# Patient Record
Sex: Male | Born: 1990 | Race: Black or African American | Hispanic: No | Marital: Single | Smoking: Current every day smoker
Health system: Southern US, Community
[De-identification: ages and names within clinical notes are randomized; demographics above are authoritative.]

## PROBLEM LIST (undated history)

## (undated) ENCOUNTER — Emergency Department: Discharge status: Absent without leave | Payer: Self-pay

## (undated) DIAGNOSIS — F39 Unspecified mood [affective] disorder: Secondary | ICD-10-CM

## (undated) HISTORY — PX: NO PAST SURGERIES: SHX2092

---
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
## 2017-05-22 ENCOUNTER — Emergency Department (HOSPITAL_COMMUNITY)
Admission: EM | Admit: 2017-05-22 | Discharge: 2017-05-23 | Disposition: A | Discharge status: Home / Self Care | Payer: Medicaid Other | Attending: Emergency Medicine | Admitting: Emergency Medicine

## 2017-05-22 ENCOUNTER — Encounter (HOSPITAL_COMMUNITY): Payer: Self-pay | Admitting: Emergency Medicine

## 2017-05-22 ENCOUNTER — Emergency Department (HOSPITAL_COMMUNITY): Payer: Medicaid Other

## 2017-05-22 DIAGNOSIS — Y999 Unspecified external cause status: Secondary | ICD-10-CM | POA: Diagnosis not present

## 2017-05-22 DIAGNOSIS — W25XXXA Contact with sharp glass, initial encounter: Secondary | ICD-10-CM | POA: Diagnosis not present

## 2017-05-22 DIAGNOSIS — S51811A Laceration without foreign body of right forearm, initial encounter: Secondary | ICD-10-CM

## 2017-05-22 DIAGNOSIS — Y929 Unspecified place or not applicable: Secondary | ICD-10-CM | POA: Insufficient documentation

## 2017-05-22 DIAGNOSIS — Y939 Activity, unspecified: Secondary | ICD-10-CM | POA: Diagnosis not present

## 2017-05-22 DIAGNOSIS — S41111A Laceration without foreign body of right upper arm, initial encounter: Secondary | ICD-10-CM

## 2017-05-22 DIAGNOSIS — Z880 Allergy status to penicillin: Secondary | ICD-10-CM | POA: Insufficient documentation

## 2017-05-22 MED ORDER — LIDOCAINE-EPINEPHRINE (PF) 2 %-1:200000 IJ SOLN
20.0000 mL | Freq: Once | INTRAMUSCULAR | Status: AC
  Administered 2017-05-23: 20 mL
  Filled 2017-05-22: qty 20

## 2017-05-22 NOTE — ED Provider Notes (Signed)
[facility] Provider Note   CSN: [id number] Arrival date & time: 05/22/17  2211    History   Chief Complaint Chief Complaint  Patient presents with  . Laceration    The history is provided by the patient. No language interpreter was used.     HPI Comments:  Alvin Moore is a 26 y.o. male who presents to the Emergency Department complaining of laceration to the right forearm after trying to catch a window as it was falling. Patient states that the glass broke and it cut his right forearm. Bleeding was controlled with direct pressure on scene. He reports increasing pain and therefore decreased range of motion of the right wrist and fingers. He is also reporting tingling and numbness of the right pinky on the palmar side. He denies injury elsewhere. His last tetanus shot was a year ago. He denies any bleeding or clotting disorders.   History reviewed. No pertinent past medical history.  There are no active problems to display for this patient.   History reviewed. No pertinent surgical history.     Home Medications    Prior to Admission medications   Medication Sig Start Date End Date Taking? Authorizing Provider  HYDROcodone-acetaminophen (NORCO/VICODIN) 5-325 MG tablet Take 1 tablet by mouth every 6 (six) hours as needed for severe pain. 05/23/17   Gavin Faivre, PA-C    Family History No family history on file.  Social History Social History  Substance Use Topics  . Smoking status: Never Smoker  . Smokeless tobacco: Never Used  . Alcohol use Yes     Allergies   Amoxicillin and Penicillins   Review of Systems Review of Systems  Skin: Positive for wound (2 laceration of R anterior forearm).  Neurological: Positive for numbness (and tingling of R pinky).     Physical Exam Updated Vital Signs BP (!) 148/111 (BP Location: Left Arm)   Pulse 87   Temp 98.3 F (36.8 C) (Oral)   Resp 18   Ht 5\' 4"  (1.626 m)   Wt 77.1 kg (170 lb)   SpO2 99%    BMI 29.18 kg/m   Physical Exam  Constitutional: He is oriented to person, place, and time. He appears well-developed and well-nourished. No distress.  HENT:  Head: Normocephalic and atraumatic.  Eyes: Conjunctivae are normal.  Cardiovascular: Normal rate.   Pulmonary/Chest: Effort normal.  Abdominal: He exhibits no distension.  Musculoskeletal:  Patient with limited range of motion of the right wrist and fingers due to pain. Pulses intact bilaterally, warmth and color of hands equal bilaterally.  Neurological: He is alert and oriented to person, place, and time.  Skin: Skin is warm and dry.  2 lacerations to the anterior right forearm. One laceration is about 4 cm long and 1/2 cm deep. Second laceration is about 3 cm long and 1 cm deep. There is no obvious foreign body. There is no obvious muscle or nerve damage.  Nursing note and vitals reviewed.    ED Treatments / Results  DIAGNOSTIC STUDIES:  Oxygen Saturation is 100% on RA, normal by my interpretation.    COORDINATION OF CARE:  2:54 AM Discussed treatment plan with pt at bedside and pt agreed to plan.  Labs (all labs ordered are listed, but only abnormal results are displayed) Labs Reviewed - No data to display  EKG  EKG Interpretation None       Radiology Dg Forearm Right  Result Date: 05/22/2017 CLINICAL DATA:  laceration EXAM: RIGHT FOREARM - 2  VIEW COMPARISON:  None. FINDINGS: There is no evidence of fracture or other focal bone lesions. Soft tissues are unremarkable. IMPRESSION: Negative. Electronically Signed   By: Jasmine PangKim  Fujinaga M.D.   On: 05/22/2017 23:58    Procedures .Marland Kitchen.Laceration Repair Date/Time: 05/23/2017 1:04 AM Performed by: Alveria ApleyACCAVALE, Surena Welge Authorized by: Alveria ApleyACCAVALE, Shereka Lafortune   Consent:    Consent obtained:  Verbal   Consent given by:  Patient   Risks discussed:  Infection, pain and need for additional repair   Alternatives discussed:  No treatment Anesthesia (see MAR for exact dosages):     Anesthesia method:  Local infiltration   Local anesthetic:  Lidocaine 2% WITH epi Laceration details:    Location:  Shoulder/arm   Shoulder/arm location:  R lower arm   Wound length (cm): 2 lacs: 4 cm, 3 cm.   Laceration depth: 2 lacs: 1.5 cm, 1 cm. Pre-procedure details:    Preparation:  Imaging obtained to evaluate for foreign bodies Exploration:    Hemostasis achieved with:  Direct pressure   Wound exploration: entire depth of wound probed and visualized     Wound extent: no foreign bodies/material noted and no underlying fracture noted     Contaminated: no   Treatment:    Area cleansed with:  Hibiclens and saline   Amount of cleaning:  Standard   Irrigation solution:  Sterile saline   Irrigation volume:  250 cc   Irrigation method:  Syringe Skin repair:    Repair method:  Sutures   Suture size:  4-0   Suture material:  Prolene (with chromic gut sub-q)   Suture technique:  Simple interrupted (horizonal mattress sub-q)   Number of sutures:  17 Approximation:    Approximation:  Close   Vermilion border: well-aligned   Post-procedure details:    Dressing:  Antibiotic ointment and non-adherent dressing   Patient tolerance of procedure:  Tolerated well, no immediate complications   (including critical care time)  Medications Ordered in ED Medications  lidocaine-EPINEPHrine (XYLOCAINE W/EPI) 2 %-1:200000 (PF) injection 20 mL (20 mLs Infiltration Given by Other 05/23/17 0001)     Initial Impression / Assessment and Plan / ED Course  I have reviewed the triage vital signs and the nursing notes.  Pertinent labs & imaging results that were available during my care of the patient were reviewed by me and considered in my medical decision making (see chart for details).    Initial assessment showed patient with decreased range of motion of right wrist and fingers due to pain. After lacerations were numbed and stitches were placed, patient had full range of motion of his wrist and  all fingers. He reports loss of sensation in his pinky finger of his right hand. X-ray showed no foreign body. Discussed wound care at length. Patient to follow-up in 7-10 days for suture removal. If patient is still experiencing pinky numbness or tingling into to 3 days, he should follow-up with hand specialist. Return precautions given. Patient very concerned about pain, and small dose of Norco prescribed. NCCSRS checked and pt without multiple prescriptions or chronic prescriptions. Patient states he understands and agrees to plan for discharge.   Final Clinical Impressions(s) / ED Diagnoses   Final diagnoses:  Laceration of right forearm, initial encounter    New Prescriptions Discharge Medication List as of 05/23/2017  1:18 AM    START taking these medications   Details  HYDROcodone-acetaminophen (NORCO/VICODIN) 5-325 MG tablet Take 1 tablet by mouth every 6 (six) hours as needed for severe  pain., Starting Thu 05/23/2017, Print          [place], [place], PA-C 05/23/17 0254    Gerhard Munch, MD 05/23/17 1234

## 2017-05-22 NOTE — ED Triage Notes (Signed)
Pt presents with laceration to R forearm, pt states he tried to catch a window as it fell, window broke causing laceration, wound wrapped with small amount of blood. Pt states he feels like he can not grasp with R hand normally.

## 2017-05-23 MED ORDER — HYDROCODONE-ACETAMINOPHEN 5-325 MG PO TABS: 1.0000 | ORAL_TABLET | Freq: Four times a day (QID) | ORAL | 0 refills | Status: DC | PRN

## 2017-05-23 NOTE — Discharge Instructions (Signed)
Keep the area covered and dry for the next 24 hours. After that, you may remove the dressing and gently wash the site. Continue to keep the area covered and you may apply triple antibiotic ointment or Neosporin on the area. You may use ibuprofen or Aleve for pain, or Norco for severe pain. Return to primary care, urgent care, or ED for suture removal in 7-10 days. If the numbness and tingling has not improved in 2-3 days, contact to make an appointment with Dr. Melvyn Novasrtmann above. Return to the emergency department if he developed fever, chills, streaking redness along her forearm, or pus draining from the cut.

## 2019-06-17 ENCOUNTER — Ambulatory Visit
Admission: EM | Admit: 2019-06-17 | Discharge: 2019-06-17 | Disposition: A | Discharge status: Home / Self Care | Payer: Medicaid Other | Attending: Emergency Medicine | Admitting: Emergency Medicine

## 2019-06-17 ENCOUNTER — Other Ambulatory Visit: Payer: Self-pay

## 2019-06-17 ENCOUNTER — Ambulatory Visit: Payer: Medicaid Other

## 2019-06-17 DIAGNOSIS — M79601 Pain in right arm: Secondary | ICD-10-CM | POA: Diagnosis present

## 2019-06-17 MED ORDER — IBUPROFEN 800 MG PO TABS: 800.0000 mg | ORAL_TABLET | Freq: Three times a day (TID) | ORAL | 0 refills | Status: DC

## 2019-06-17 MED ORDER — KETOROLAC TROMETHAMINE 60 MG/2ML IM SOLN
60.0000 mg | Freq: Once | INTRAMUSCULAR | Status: AC
  Administered 2019-06-17: 60 mg via INTRAMUSCULAR

## 2019-06-17 NOTE — ED Triage Notes (Signed)
Patient states that he is having numbness and pain in his right hand and arm. States that he is "having surgery on my arm muscles in August for this same issue". Patient states that he feels like the pain and numbness are worse today. States that pain is in fingers and radiates to elbow.

## 2019-06-17 NOTE — ED Provider Notes (Signed)
[facility]    CSN: [id number] Arrival date & time: 06/17/19  1606     History   Chief Complaint Chief Complaint  Patient presents with  . Arm Pain    right    HPI Alvin Moore is a 28 y.o. male presenting with increased numbness and tinglings to right arm. Pt states he has chronic numbness and tingling to arm since a laceration to his forearm 2 years ago. However, pt states increased numbness, swelling and pain to right forearm for 5 days, the worse occurrence being this AM. Pt did not try any home remedies to alleviate pain. Pt states the pain and numbness has gotten to the point that he cannot grip his steering wheel. No additional injuries of note.    History reviewed. No pertinent past medical history.  There are no active problems to display for this patient.   Past Surgical History:  Procedure Laterality Date  . NO PAST SURGERIES         Home Medications    Prior to Admission medications   Medication Sig Start Date End Date Taking? Authorizing Provider  divalproex (DEPAKOTE ER) 500 MG 24 hr tablet Take 1,500 mg by mouth at bedtime. 05/28/19  Yes [provider]  hydrOXYzine (VISTARIL) 50 MG capsule Take 100 mg by mouth at bedtime. 05/28/19  Yes [provider]  HYDROcodone-acetaminophen (NORCO/VICODIN) 5-325 MG tablet Take 1 tablet by mouth every 6 (six) hours as needed for severe pain. 05/23/17   [place], Sophia, PA-C  ibuprofen (ADVIL) 800 MG tablet Take 1 tablet (800 mg total) by mouth 3 (three) times daily. Take with food. 06/17/19   Bailey MechBenjamin, Flay Ghosh, NP    Family History Family History  Problem Relation Age of Onset  . Hypertension Mother   . Osteoarthritis Father     Social History Social History   Tobacco Use  . Smoking status: Current Every Day Smoker    Packs/day: 0.50    Types: Cigarettes  . Smokeless tobacco: Never Used  Substance Use Topics  . Alcohol use: Yes    Comment: occasionally  . Drug use: Yes   Types: Marijuana     Allergies   Amoxicillin and Penicillins   Review of Systems Review of Systems  Musculoskeletal: Positive for arthralgias and myalgias. Negative for joint swelling, neck pain and neck stiffness.     Physical Exam Triage Vital Signs ED Triage Vitals  Enc Vitals Group     BP 06/17/19 1622 (!) 131/99     Pulse Rate 06/17/19 1622 84     Resp 06/17/19 1622 16     Temp 06/17/19 1622 98.5 F (36.9 C)     Temp Source 06/17/19 1622 Oral     SpO2 06/17/19 1622 97 %     Weight 06/17/19 1618 202 lb (91.6 kg)     Height 06/17/19 1618 5\' 4"  (1.626 m)     Head Circumference --      Peak Flow --      Pain Score 06/17/19 1618 5     Pain Loc --      Pain Edu? --      Excl. in GC? --    No data found.  Updated Vital Signs BP (!) 131/99 (BP Location: Left Arm)   Pulse 84   Temp 98.5 F (36.9 C) (Oral)   Resp 16   Ht 5\' 4"  (1.626 m)   Wt 202 lb (91.6 kg)   SpO2 97%   BMI 34.67  kg/m   Physical Exam Vitals signs and nursing note reviewed.  Cardiovascular:     Pulses:          Radial pulses are 2+ on the right side and 2+ on the left side.  Musculoskeletal:     Right hand: He exhibits swelling. He exhibits normal capillary refill. Decreased sensation noted. Decreased strength noted.     Comments: Decreased strength to right arm. Unable to keep arm up with resistance.   Neurological:     Mental Status: He is alert and oriented to person, place, and time.     Sensory: Sensory deficit present.     Motor: Weakness present. No atrophy.  Psychiatric:        Thought Content: Thought content normal.        Judgment: Judgment normal.      UC Treatments / Results  Labs (all labs ordered are listed, but only abnormal results are displayed) Labs Reviewed - No data to display  EKG   Radiology Dg Cervical Spine Complete  Result Date: 06/17/2019 CLINICAL DATA:  Right arm and elbow pain with numbness, initial encounter EXAM: CERVICAL SPINE - COMPLETE 4+ VIEW  COMPARISON:  None. FINDINGS: Seven cervical segments are well visualized. Vertebral body height is well maintained. Neural foramina are widely patent. No prevertebral soft tissue abnormality is seen. The odontoid is within normal limits. Small cervical ribs are noted bilaterally. IMPRESSION: Small cervical ribs bilaterally.  No acute abnormality is noted. Electronically Signed   By: Inez Catalina M.D.   On: 06/17/2019 17:03   Unable to perform soft tissue imaging of right forearm due to lack of CT and pt's type of insurance.   Procedures (including critical care time)  Medications Ordered in UC Medications  ketorolac (TORADOL) injection 60 mg (60 mg Intramuscular Given 06/17/19 1723)    Initial Impression / Assessment and Plan / UC Course  I have reviewed the triage vital signs and the nursing notes.  Pertinent labs & imaging results that were available during my care of the patient were reviewed by me and considered in my medical decision making (see chart for details).     Pt presenting with right arm pain/numbness with decreased strength and treated as such with toradol in office and a prescription of ibuprofen 800mg  three times a day. Pt instructed to follow-up with PCP ASAP for further imaging. Pt instructed to go to ER if pain, lack of sensation and or/ swelling increases or doesn't resolve. All questions answered and all concerns addressed.   Final Clinical Impressions(s) / UC Diagnoses   Final diagnoses:  Right arm pain   Discharge Instructions   None    ED Prescriptions    Medication Sig Dispense Auth. Provider   ibuprofen (ADVIL) 800 MG tablet Take 1 tablet (800 mg total) by mouth 3 (three) times daily. Take with food. 30 tablet Gertie Baron, NP       Gertie Baron, NP 06/17/19 [phone]

## 2019-07-07 ENCOUNTER — Ambulatory Visit: Payer: Medicaid Other | Admitting: Physical Therapy

## 2019-07-08 ENCOUNTER — Ambulatory Visit: Payer: Medicaid Other | Attending: Orthopedic Surgery | Admitting: Physical Therapy

## 2019-07-24 ENCOUNTER — Encounter: Payer: Self-pay | Admitting: Physical Therapy

## 2019-07-24 ENCOUNTER — Other Ambulatory Visit: Payer: Self-pay

## 2019-07-24 ENCOUNTER — Ambulatory Visit: Payer: Medicaid Other | Attending: Orthopedic Surgery | Admitting: Physical Therapy

## 2019-07-24 DIAGNOSIS — R208 Other disturbances of skin sensation: Secondary | ICD-10-CM | POA: Insufficient documentation

## 2019-07-24 DIAGNOSIS — M6281 Muscle weakness (generalized): Secondary | ICD-10-CM | POA: Insufficient documentation

## 2019-07-24 DIAGNOSIS — M79631 Pain in right forearm: Secondary | ICD-10-CM | POA: Diagnosis not present
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
## 2019-07-31 ENCOUNTER — Ambulatory Visit: Payer: Medicaid Other | Admitting: Physical Therapy

## 2019-08-04 ENCOUNTER — Ambulatory Visit: Payer: Medicaid Other | Admitting: Physical Therapy

## 2019-08-04 ENCOUNTER — Telehealth: Payer: Self-pay | Admitting: Physical Therapy

## 2019-08-04 ENCOUNTER — Encounter: Payer: Self-pay | Admitting: Physical Therapy

## 2019-08-04 NOTE — Telephone Encounter (Signed)
Called patient regarding missed appt this AM, left a message with family member for him to call the clinic if he does not want further PT.  Reminded of appt on Friday and gave number to call.   Raeford Razor, PT 08/04/19 9:04 AM Phone: [phone] Fax: [phone]

## 2019-08-07 ENCOUNTER — Ambulatory Visit: Payer: Medicaid Other | Admitting: Physical Therapy

## 2019-08-10 ENCOUNTER — Ambulatory Visit: Payer: Medicaid Other | Admitting: Physical Therapy

## 2019-08-20 ENCOUNTER — Other Ambulatory Visit: Payer: Self-pay

## 2019-08-20 ENCOUNTER — Ambulatory Visit: Payer: Medicaid Other

## 2019-08-20 ENCOUNTER — Ambulatory Visit
Admission: EM | Admit: 2019-08-20 | Discharge: 2019-08-20 | Disposition: A | Discharge status: Home / Self Care | Payer: Medicaid Other | Attending: Family Medicine | Admitting: Family Medicine

## 2019-08-20 ENCOUNTER — Encounter: Payer: Self-pay | Admitting: Emergency Medicine

## 2019-08-20 DIAGNOSIS — S6992XA Unspecified injury of left wrist, hand and finger(s), initial encounter: Secondary | ICD-10-CM | POA: Insufficient documentation

## 2019-08-20 DIAGNOSIS — W228XXA Striking against or struck by other objects, initial encounter: Secondary | ICD-10-CM

## 2019-08-20 HISTORY — DX: Unspecified mood (affective) disorder: F39

## 2019-08-20 IMAGING — CR DG FINGER THUMB 2+V*L*
3 series · 3 of 3 positions shown · non-contrast
Comparison: None.

CLINICAL DATA: Jammed thumb, pain and swelling

EXAM:
LEFT THUMB 2+V

[finger ap]
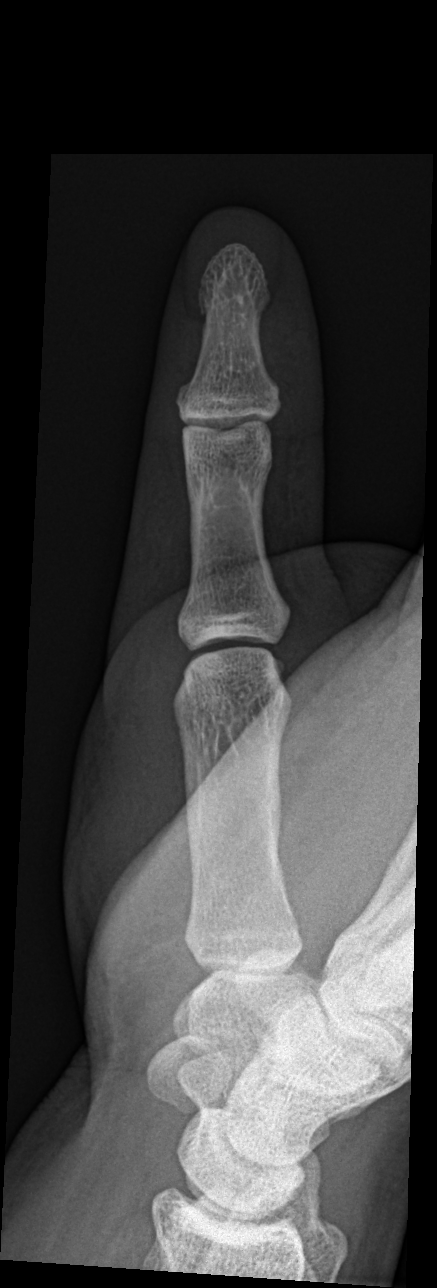

[finger obl]
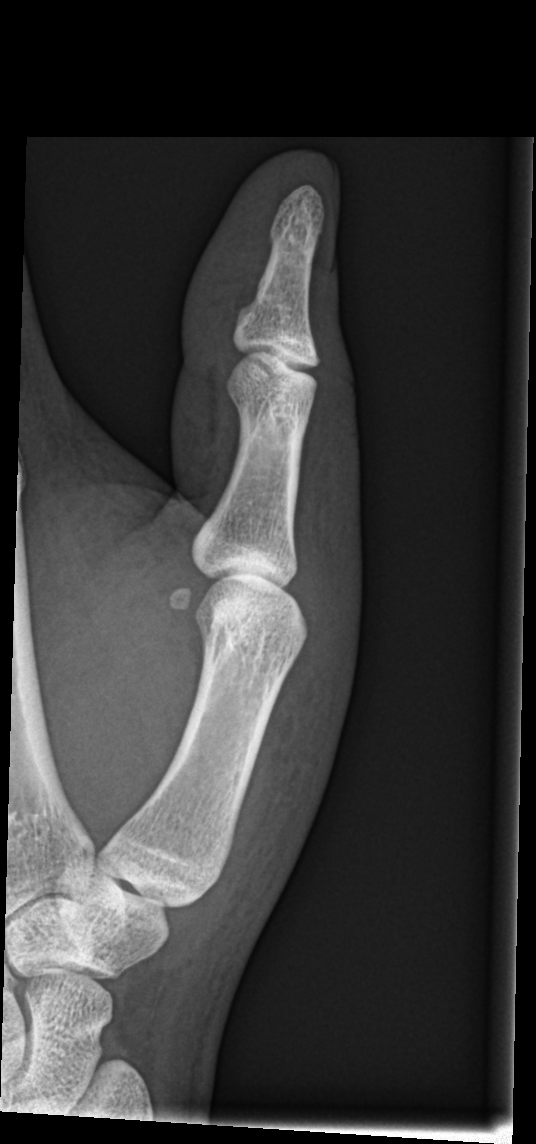

[finger lat]
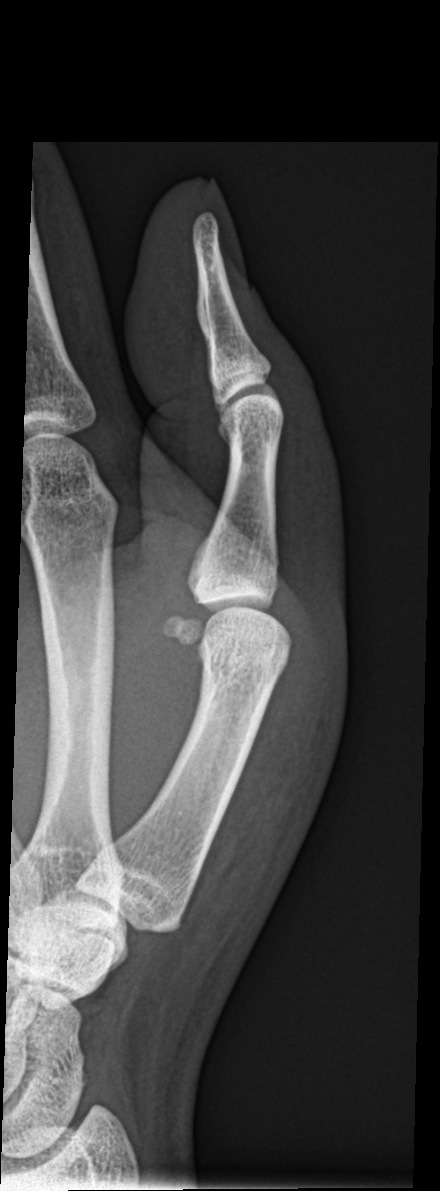

[3 of 3 positions shown; findings below may reference images not displayed]

FINDINGS: No fracture or dislocation of the left thumb. Joint spaces are well
preserved. Soft tissue edema about the thumb.
IMPRESSION: No fracture or dislocation of the left thumb. Joint spaces are well
preserved. Soft tissue edema about the thumb.

## 2019-08-20 MED ORDER — MELOXICAM 15 MG PO TABS: 15.0000 mg | ORAL_TABLET | Freq: Every day | ORAL | 0 refills | Status: DC | PRN

## 2019-08-20 NOTE — Discharge Instructions (Signed)
Ice.  Medication as directed.  Take care  Dr. Lacinda Axon

## 2019-08-20 NOTE — ED Triage Notes (Signed)
Patient c/o jamming his left thumb 1 week ago. Patient c/o pain and swelling.

## 2019-08-20 NOTE — ED Provider Notes (Signed)
[facility]    CSN: [id number] Arrival date & time: 08/20/19  1735  History   Chief Complaint Chief Complaint  Patient presents with  . Hand Pain   HPI  28 year old male presents with left thumb pain.  Patient reports that he "jammed" his thumb 1 week ago.  He states that he was playing outside and went around the house and accidentally jammed his finger while trying to go around the corner of the house.  Patient reports pain and swelling of the left thumb particularly at the MCP joint.  He is able to move it without difficulty.  He does note pain when he grips an object.  No relieving factors.  No medications tried.  No other associated symptoms.  No other complaints.  PMH, Surgical Hx, Family Hx, Social History reviewed and updated as below.  Past Medical History:  Diagnosis Date  . Mood disorder [facility])    Past Surgical History:  Procedure Laterality Date  . NO PAST SURGERIES      Home Medications    Prior to Admission medications   Medication Sig Start Date End Date Taking? Authorizing Provider  divalproex (DEPAKOTE ER) 500 MG 24 hr tablet Take 1,500 mg by mouth at bedtime. 05/28/19  Yes [provider]  hydrOXYzine (VISTARIL) 50 MG capsule Take 100 mg by mouth at bedtime. 05/28/19  Yes [provider]  meloxicam (MOBIC) 15 MG tablet Take 1 tablet (15 mg total) by mouth daily as needed for pain. 08/20/19   Coral Spikes, DO    Family History Family History  Problem Relation Age of Onset  . Hypertension Mother   . Osteoarthritis Father     Social History Social History   Tobacco Use  . Smoking status: Current Every Day Smoker    Packs/day: 0.50    Types: Cigarettes  . Smokeless tobacco: Never Used  Substance Use Topics  . Alcohol use: Yes    Comment: occasionally  . Drug use: Yes    Types: Marijuana     Allergies   Amoxicillin and Penicillins   Review of Systems Review of Systems  Constitutional: Negative.    Musculoskeletal:       Left thumb pain, swelling.   Physical Exam Triage Vital Signs ED Triage Vitals  Enc Vitals Group     BP 08/20/19 1749 109/66     Pulse Rate 08/20/19 1749 78     Resp 08/20/19 1749 18     Temp 08/20/19 1749 98.6 F (37 C)     Temp Source 08/20/19 1749 Oral     SpO2 08/20/19 1749 100 %     Weight 08/20/19 1747 190 lb (86.2 kg)     Height 08/20/19 1747 5\' 5"  (1.651 m)     Head Circumference --      Peak Flow --      Pain Score 08/20/19 1747 7     Pain Loc --      Pain Edu? --      Excl. in [place]? --    Updated Vital Signs BP 109/66 (BP Location: Left Arm)   Pulse 78   Temp 98.6 F (37 C) (Oral)   Resp 18   Ht 5\' 5"  (1.651 m)   Wt 86.2 kg   SpO2 100%   BMI 31.62 kg/m   Visual Acuity Right Eye Distance:   Left Eye Distance:   Bilateral Distance:    Right Eye Near:   Left Eye Near:    Bilateral  Near:     Physical Exam Vitals signs and nursing note reviewed.  Constitutional:      General: He is not in acute distress.    Appearance: Normal appearance. He is not ill-appearing.  HENT:     Head: Normocephalic and atraumatic.  Eyes:     General:        Right eye: No discharge.        Left eye: No discharge.     Conjunctiva/sclera: Conjunctivae normal.  Cardiovascular:     Rate and Rhythm: Normal rate and regular rhythm.     Heart sounds: No murmur.  Pulmonary:     Effort: Pulmonary effort is normal.     Breath sounds: Normal breath sounds. No wheezing, rhonchi or rales.  Musculoskeletal:     Comments: Left thumb -swelling noted around the MCP joint.  Tenderness at the MCP joint.  Normal range of motion.  Neurological:     Mental Status: He is alert.  Psychiatric:        Mood and Affect: Mood normal.        Behavior: Behavior normal.    UC Treatments / Results  Labs (all labs ordered are listed, but only abnormal results are displayed) Labs Reviewed - No data to display  EKG   Radiology Dg Finger Thumb Left  Result Date:  08/20/2019 CLINICAL DATA:  Jammed thumb, pain and swelling EXAM: LEFT THUMB 2+V COMPARISON:  None. FINDINGS: No fracture or dislocation of the left thumb. Joint spaces are well preserved. Soft tissue edema about the thumb. IMPRESSION: No fracture or dislocation of the left thumb. Joint spaces are well preserved. Soft tissue edema about the thumb. Electronically Signed   By: Lauralyn PrimesAlex  Bibbey M.D.   On: 08/20/2019 18:18    Procedures Procedures (including critical care time)  Medications Ordered in UC Medications - No data to display  Initial Impression / Assessment and Plan / UC Course  I have reviewed the triage vital signs and the nursing notes.  Pertinent labs & imaging results that were available during my care of the patient were reviewed by me and considered in my medical decision making (see chart for details).    28 year old male presents with left lobe injury.  X-rays negative.  Advised rest, ice.  Supportive care.  Meloxicam as directed.  Final Clinical Impressions(s) / UC Diagnoses   Final diagnoses:  Injury of left thumb, initial encounter     Discharge Instructions     Ice.  Medication as directed.  Take care  Dr. Adriana Simasook    ED Prescriptions    Medication Sig Dispense Auth. Provider   meloxicam (MOBIC) 15 MG tablet Take 1 tablet (15 mg total) by mouth daily as needed for pain. 30 tablet Tommie Samsook, Huel Centola G, DO     Controlled Substance Prescriptions  Controlled Substance Registry consulted? Not Applicable   Tommie SamsCook, Gerene Nedd G, DO 08/20/19 1840

## 2019-09-08 ENCOUNTER — Ambulatory Visit
Admission: EM | Admit: 2019-09-08 | Discharge: 2019-09-08 | Disposition: A | Discharge status: Home / Self Care | Payer: Medicaid Other

## 2019-09-08 ENCOUNTER — Encounter: Payer: Self-pay | Admitting: Emergency Medicine

## 2019-09-08 ENCOUNTER — Other Ambulatory Visit: Payer: Self-pay

## 2019-09-08 DIAGNOSIS — H6593 Unspecified nonsuppurative otitis media, bilateral: Secondary | ICD-10-CM | POA: Diagnosis not present

## 2019-09-08 DIAGNOSIS — R42 Dizziness and giddiness: Secondary | ICD-10-CM | POA: Diagnosis not present

## 2019-09-08 NOTE — ED Triage Notes (Signed)
Patient was incarcerated for 37yrs was informed that he had High blood pressure but was never given any meds. C/O:Headache, sob, fatigue FSE:[id number]

## 2019-09-08 NOTE — Discharge Instructions (Addendum)
It was very nice seeing you today in clinic. Thank you for entrusting me with your care.   I think that your dizziness if related to fluid behind your ears. That is generally related to allergies. I would recommend starting some allergy medication (Zyrtec or Claritin) to help with the symptoms. I would also recommend you seeing your regular doctor for a complete physical after you have gotten out of jail.   Make arrangements to follow up with your regular doctor in 1 week for re-evaluation if not improving. If your symptoms/condition worsens, please seek follow up care either here or in the ER. Please remember, our [place] providers are "right here with you" when you need [place].   Again, it was my pleasure to take care of you today. Thank you for choosing our clinic. I hope that you start to feel better quickly.   Honor Loh, MSN, APRN, FNP-C, CEN Advanced Practice Provider [facility]

## 2019-09-09 NOTE — ED Provider Notes (Signed)
Alvin Moore, [name]   Name: Alvin Moore DOB: May 26, 1991 MRN: [id number] CSN: [id number] PCP: Patient, No Pcp Per  Arrival date and time:  09/08/19 1914  Chief Complaint:  Hypertension   NOTE: Prior to seeing the patient today, I have reviewed the triage nursing documentation and vital signs. Clinical staff has updated patient's PMH/PSHx, current medication list, and drug allergies/intolerances to ensure comprehensive history available to assist in medical decision making.   History:   HPI: Alvin Moore is a 28 y.o. male who presents today with complaints of dizziness, headaches,  and fatigue that has been going on intermittently for months.  Patient advising that he was told during a recent incarceration that he had hypertension and needed to be on blood pressure medicine, however he states, "I was never given any medicine".  Patient's incarceration ended in 02/2019.  Patient has not been seen by his primary care physician to discuss further evaluation of his blood pressure; patient of [facility] in [place].  Patient presents today with initial blood pressure of 127/101.  He denies any chest pain or palpitations.  He notes mild shortness of breath with exertion.  Patient advises that he has been controlling his blood pressure by drinking "quarts and quarts of water followed by lots of vinegar".  Of note, strong odor of marijuana appreciated during exam today.  Past Medical History:  Diagnosis Date  . Mood disorder [facility])     Past Surgical History:  Procedure Laterality Date  . NO PAST SURGERIES      Family History  Problem Relation Age of Onset  . Hypertension Mother   . Osteoarthritis Father     Social History   Tobacco Use  . Smoking status: Current Every Day Smoker    Packs/day: 0.50    Types: Cigarettes  . Smokeless tobacco: Never Used  Substance Use Topics  . Alcohol use: Yes    Comment: occasionally  . Drug use: Yes    Types: Marijuana    There are  no active problems to display for this patient.   Home Medications:    Current Meds  Medication Sig  . divalproex (DEPAKOTE ER) 500 MG 24 hr tablet Take 1,500 mg by mouth at bedtime.  . hydrOXYzine (VISTARIL) 50 MG capsule Take 100 mg by mouth at bedtime.    Allergies:   Amoxicillin and Penicillins  Review of Systems (ROS): Review of Systems  Constitutional: Negative for chills and fever.  HENT: Negative for congestion, rhinorrhea, sinus pressure, sinus pain, sore throat and trouble swallowing.   Respiratory: Positive for shortness of breath (Exertional). Negative for cough.   Cardiovascular: Negative for chest pain and palpitations.  Gastrointestinal: Negative for abdominal pain, diarrhea, nausea and vomiting.  Musculoskeletal: Negative for back pain and neck pain.  Skin: Negative for color change, pallor and rash.  Neurological: Positive for dizziness and headaches. Negative for tremors and weakness.  Psychiatric/Behavioral: The patient is nervous/anxious.   All other systems reviewed and are negative.    Vital Signs: Today's Vitals   09/08/19 1931 09/08/19 1935 09/08/19 1959 09/08/19 2002  BP:  (!) 127/101 126/86   Pulse:  82    Resp:  18    Temp:  98.1 F (36.7 C)    SpO2:  100%    Height: 5\' 4"  (1.626 m)     PainSc: 5    5     Physical Exam: Physical Exam  Constitutional: He is oriented to person, place, and time and well-developed, well-nourished,  and in no distress.  HENT:  Head: Normocephalic and atraumatic.  Right Ear: No tenderness. Tympanic membrane is not injected. A middle ear effusion is present.  Left Ear: No tenderness. Tympanic membrane is not injected. A middle ear effusion is present.  Nose: Nose normal.  Mouth/Throat: Uvula is midline, oropharynx is clear and moist and mucous membranes are normal.  Eyes: Pupils are equal, round, and reactive to light. EOM are normal.  Neck: Normal range of motion. Neck supple. No tracheal deviation present.   Cardiovascular: Normal rate, regular rhythm, normal heart sounds and intact distal pulses. Exam reveals no gallop and no friction rub.  No murmur heard. Pulmonary/Chest: Effort normal and breath sounds normal. No respiratory distress. He has no wheezes. He has no rales.  Abdominal: Soft. Bowel sounds are normal. He exhibits no distension. There is no abdominal tenderness.  Neurological: He is alert and oriented to person, place, and time. He has normal sensation, normal strength, normal reflexes and intact cranial nerves. Gait normal. GCS score is 15.  Skin: Skin is warm and dry. No rash noted.  Psychiatric: Memory, affect and judgment normal. His mood appears anxious.  Patient presents today with request to be started on blood pressure medication  Nursing note and vitals reviewed.   Urgent Care Treatments / Results:   LABS: PLEASE NOTE: all labs that were ordered this encounter are listed, however only abnormal results are displayed. Labs Reviewed - No data to display  EKG: -None  RADIOLOGY: No results found.  PROCEDURES: Procedures  MEDICATIONS RECEIVED THIS VISIT: Medications - No data to display  PERTINENT CLINICAL COURSE NOTES/UPDATES:   Initial Impression / Assessment and Plan / Urgent Care Course:  Pertinent labs & imaging results that were available during my care of the patient were personally reviewed by me and considered in my medical decision making (see lab/imaging section of note for values and interpretations).  Alvin Moore is a 28 y.o. male who [facility] today with complaints of fatigue, headaches, and elevated blood pressure.  Patient is well appearing overall in clinic today. He does not appear to be in any acute distress. Presenting symptoms (see HPI) and exam as documented above.  Patient presents today with request to be started on blood pressure medications.  Initial diastolic blood pressure elevated over 100, however after  patient sits and quiet exam room he was found to be normotensive.  Discussed that current blood pressure does not warrant being started on antihypertensives at this time.  Reviewed that dizziness could potentially be related to allergies see where he is he has a mild fluid level behind both of his ears.  Discussed over-the-counter allergy medications to help mitigate this symptom.  Shortness of breath could be related to his smoking and drug use.  Patient advised that he needs to see his primary care physician for a CPE and to discuss his concerns regarding his symptoms.  Discussed follow up with primary care physician in 1 week for re-evaluation. I have reviewed the follow up and strict return precautions for any new or worsening symptoms. Patient is aware of symptoms that would be deemed urgent/emergent, and would thus require further evaluation either here or in the emergency department. At the time of discharge, he verbalized understanding and consent with the discharge plan as it was reviewed with him. All questions were fielded by provider and/or clinic staff prior to patient discharge.    Final Clinical Impressions / Urgent Care Diagnoses:   Final  diagnoses:  Dizziness  Fluid level behind tympanic membrane of both ears    New Prescriptions:  [place] Controlled Substance Registry consulted? Not Applicable  No orders of the defined types were placed in this encounter.   Recommended Follow up Care:  Patient encouraged to follow up with the following provider within the specified time frame, or sooner as dictated by the severity of his symptoms. As always, he was instructed that for any urgent/emergent care needs, he should seek care either here or in the emergency department for more immediate evaluation.  Follow-up Information    PCP.   Why: General reassessment of symptoms if not improving        NOTE: This note was prepared using Scientist, clinical (histocompatibility and immunogenetics) along with smaller Sport and exercise psychologist. Despite my best ability to proofread, there is the potential that transcriptional errors may still occur from this process, and are completely unintentional.     Verlee Monte, NP 09/10/19 [phone]

## 2020-04-01 ENCOUNTER — Encounter: Payer: Self-pay | Admitting: Emergency Medicine

## 2020-04-01 ENCOUNTER — Other Ambulatory Visit: Payer: Self-pay

## 2020-04-01 ENCOUNTER — Ambulatory Visit
Admission: EM | Admit: 2020-04-01 | Discharge: 2020-04-01 | Disposition: A | Discharge status: Home / Self Care | Payer: Medicaid Other | Attending: Family Medicine | Admitting: Family Medicine

## 2020-04-01 DIAGNOSIS — R1013 Epigastric pain: Secondary | ICD-10-CM | POA: Diagnosis not present

## 2020-04-01 DIAGNOSIS — R112 Nausea with vomiting, unspecified: Secondary | ICD-10-CM | POA: Diagnosis not present

## 2020-04-01 MED ORDER — PANTOPRAZOLE SODIUM 40 MG PO TBEC: 40.0000 mg | DELAYED_RELEASE_TABLET | Freq: Two times a day (BID) | ORAL | 0 refills | Status: AC

## 2020-04-01 MED ORDER — ONDANSETRON HCL 4 MG PO TABS: 4.0000 mg | ORAL_TABLET | Freq: Three times a day (TID) | ORAL | 0 refills | Status: AC | PRN

## 2020-04-01 NOTE — ED Provider Notes (Signed)
[facility]    CSN: [id number] Arrival date & time: 04/01/20  1127  History   Chief Complaint Chief Complaint  Patient presents with  . Abdominal Pain   HPI  29 year old male presents with abdominal pain and nausea/vomiting.  Started abruptly today after he ate some meat loaf and potatoes.  Patient states that he is having severe upper abdominal pain which is located midline.  He reports 1 episode of emesis.  No fever.  No reports of diarrhea.  Rates his pain as 7/10 in severity and describes it as sharp.  No medications or interventions tried.  This started approximately 30 minutes prior to arrival.  No other associated symptoms.  No other complaints or concerns at this time.  Past Medical History:  Diagnosis Date  . Mood disorder [facility])    Past Surgical History:  Procedure Laterality Date  . NO PAST SURGERIES     Home Medications    Prior to Admission medications   Medication Sig Start Date End Date Taking? Authorizing Provider  divalproex (DEPAKOTE ER) 500 MG 24 hr tablet Take 1,500 mg by mouth at bedtime. 05/28/19   [provider]  hydrOXYzine (VISTARIL) 50 MG capsule Take 100 mg by mouth at bedtime. 05/28/19   [provider]  ondansetron (ZOFRAN) 4 MG tablet Take 1 tablet (4 mg total) by mouth every 8 (eight) hours as needed for nausea or vomiting. 04/01/20   Coral Spikes, DO  pantoprazole (PROTONIX) 40 MG tablet Take 1 tablet (40 mg total) by mouth 2 (two) times daily before a meal. 04/01/20   Coral Spikes, DO    Family History Family History  Problem Relation Age of Onset  . Hypertension Mother   . Osteoarthritis Father     Social History Social History   Tobacco Use  . Smoking status: Current Every Day Smoker    Packs/day: 0.50    Types: Cigarettes  . Smokeless tobacco: Never Used  Substance Use Topics  . Alcohol use: Yes    Comment: occasionally  . Drug use: Yes    Types: Marijuana     Allergies   Amoxicillin and  Penicillins   Review of Systems Review of Systems  Constitutional: Negative for fever.  Gastrointestinal: Positive for abdominal pain, nausea and vomiting. Negative for diarrhea.   Physical Exam Triage Vital Signs ED Triage Vitals  Enc Vitals Group     BP 04/01/20 1141 125/87     Pulse Rate 04/01/20 1141 62     Resp 04/01/20 1141 16     Temp 04/01/20 1141 98 F (36.7 C)     Temp Source 04/01/20 1141 Oral     SpO2 04/01/20 1141 98 %     Weight 04/01/20 1139 190 lb (86.2 kg)     Height 04/01/20 1139 5\' 4"  (1.626 m)     Head Circumference --      Peak Flow --      Pain Score 04/01/20 1138 7     Pain Loc --      Pain Edu? --      Excl. in [place]? --    Updated Vital Signs BP 125/87 (BP Location: Left Arm)   Pulse 62   Temp 98 F (36.7 C) (Oral)   Resp 16   Ht 5\' 4"  (1.626 m)   Wt 86.2 kg   SpO2 98%   BMI 32.61 kg/m   Visual Acuity Right Eye Distance:   Left Eye Distance:   Bilateral Distance:  Right Eye Near:   Left Eye Near:    Bilateral Near:     Physical Exam Vitals and nursing note reviewed.  Constitutional:      Appearance: He is well-developed.     Comments: Appears uncomfortable.  HENT:     Head: Normocephalic and atraumatic.  Eyes:     General:        Right eye: No discharge.        Left eye: No discharge.     Conjunctiva/sclera: Conjunctivae normal.  Cardiovascular:     Rate and Rhythm: Normal rate and regular rhythm.  Pulmonary:     Effort: Pulmonary effort is normal.     Breath sounds: Normal breath sounds. No wheezing, rhonchi or rales.  Abdominal:     General: There is no distension.     Palpations: Abdomen is soft.     Comments: Epigastric tenderness to palpation.  Neurological:     Mental Status: He is alert.  Psychiatric:        Mood and Affect: Mood normal.        Behavior: Behavior normal.     UC Treatments / Results  Labs (all labs ordered are listed, but only abnormal results are displayed) Labs Reviewed - No data to  display  EKG   Radiology No results found.  Procedures Procedures (including critical care time)  Medications Ordered in UC Medications - No data to display  Initial Impression / Assessment and Plan / UC Course  I have reviewed the triage vital signs and the nursing notes.  Pertinent labs & imaging results that were available during my care of the patient were reviewed by me and considered in my medical decision making (see chart for details).    29 year old male presents with epigastric pain, nausea/vomiting. Treating with Zofran and Protonix.   Final Clinical Impressions(s) / UC Diagnoses   Final diagnoses:  Epigastric pain  Non-intractable vomiting with nausea, unspecified vomiting type     Discharge Instructions     Fluids.  Medication as prescribed.  Take care  Dr. Adriana Simas    ED Prescriptions    Medication Sig Dispense Auth. Provider   ondansetron (ZOFRAN) 4 MG tablet Take 1 tablet (4 mg total) by mouth every 8 (eight) hours as needed for nausea or vomiting. 20 tablet Zinedine Ellner G, DO   pantoprazole (PROTONIX) 40 MG tablet Take 1 tablet (40 mg total) by mouth 2 (two) times daily before a meal. 60 tablet Everlene Other G, DO     PDMP not reviewed this encounter.   Tommie Sams, [place] 04/01/20 1308

## 2020-04-01 NOTE — ED Triage Notes (Signed)
Patient states that he ate food that he warmed up in the microwave this morning and started having sharp mid upper abdominal pain.  Patient states that he threw up.  Patient states that he previously cleaned the microwave with bleach and water.

## 2020-04-01 NOTE — Discharge Instructions (Signed)
Fluids.  Medication as prescribed.  Take care  Dr. Maitland Lesiak  

## 2020-10-20 ENCOUNTER — Ambulatory Visit
Admission: EM | Admit: 2020-10-20 | Discharge: 2020-10-20 | Disposition: A | Discharge status: Home / Self Care | Payer: Medicaid Other | Attending: Emergency Medicine | Admitting: Emergency Medicine

## 2020-10-20 ENCOUNTER — Other Ambulatory Visit: Payer: Self-pay

## 2020-10-20 DIAGNOSIS — M542 Cervicalgia: Secondary | ICD-10-CM
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
## 2020-10-20 NOTE — Discharge Instructions (Addendum)
As we discussed, there is concern that you might have a broken neck.  As advised, an ER evaluation would be the safest plan of action.  Please go to the ER for evaluation since you declined EMS transport.
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
## 2020-10-22 ENCOUNTER — Emergency Department
Admission: EM | Admit: 2020-10-22 | Discharge: 2020-10-22 | Disposition: A | Discharge status: Home / Self Care | Payer: No Typology Code available for payment source | Attending: Emergency Medicine | Admitting: Emergency Medicine

## 2020-10-22 ENCOUNTER — Emergency Department: Payer: No Typology Code available for payment source

## 2020-10-22 ENCOUNTER — Other Ambulatory Visit: Payer: Self-pay

## 2020-10-22 ENCOUNTER — Encounter: Payer: Self-pay | Admitting: Emergency Medicine

## 2020-10-22 DIAGNOSIS — S3991XA Unspecified injury of abdomen, initial encounter: Secondary | ICD-10-CM | POA: Insufficient documentation

## 2020-10-22 DIAGNOSIS — R109 Unspecified abdominal pain: Secondary | ICD-10-CM

## 2020-10-22 DIAGNOSIS — Y92410 Unspecified street and highway as the place of occurrence of the external cause: Secondary | ICD-10-CM | POA: Insufficient documentation

## 2020-10-22 DIAGNOSIS — S161XXA Strain of muscle, fascia and tendon at neck level, initial encounter: Secondary | ICD-10-CM | POA: Diagnosis not present

## 2020-10-22 DIAGNOSIS — S199XXA Unspecified injury of neck, initial encounter: Secondary | ICD-10-CM | POA: Diagnosis present

## 2020-10-22 DIAGNOSIS — F17210 Nicotine dependence, cigarettes, uncomplicated: Secondary | ICD-10-CM | POA: Diagnosis not present

## 2020-10-22 LAB — CBC WITH DIFFERENTIAL/PLATELET
Abs Immature Granulocytes: 0.03 10*3/uL (ref 0.00–0.07)
Basophils Absolute: 0.1 10*3/uL (ref 0.0–0.1)
Basophils Relative: 1 %
Eosinophils Absolute: 0.3 10*3/uL (ref 0.0–0.5)
Eosinophils Relative: 4 %
HCT: 39 % (ref 39.0–52.0)
Hemoglobin: 12.4 g/dL — ABNORMAL LOW (ref 13.0–17.0)
Immature Granulocytes: 0 %
Lymphocytes Relative: 22 %
Lymphs Abs: 1.6 10*3/uL (ref 0.7–4.0)
MCH: 23.9 pg — ABNORMAL LOW (ref 26.0–34.0)
MCHC: 31.8 g/dL (ref 30.0–36.0)
MCV: 75.1 fL — ABNORMAL LOW (ref 80.0–100.0)
Monocytes Absolute: 0.5 10*3/uL (ref 0.1–1.0)
Monocytes Relative: 7 %
Neutro Abs: 5 10*3/uL (ref 1.7–7.7)
Neutrophils Relative %: 66 %
Platelets: 210 10*3/uL (ref 150–400)
RBC: 5.19 MIL/uL (ref 4.22–5.81)
RDW: 13.7 % (ref 11.5–15.5)
WBC: 7.5 10*3/uL (ref 4.0–10.5)
nRBC: 0 % (ref 0.0–0.2)

## 2020-10-22 LAB — URINALYSIS, COMPLETE (UACMP) WITH MICROSCOPIC
Bacteria, UA: NONE SEEN
Bilirubin Urine: NEGATIVE
Glucose, UA: NEGATIVE mg/dL
Hgb urine dipstick: NEGATIVE
Ketones, ur: NEGATIVE mg/dL
Leukocytes,Ua: NEGATIVE
Nitrite: NEGATIVE
Protein, ur: NEGATIVE mg/dL
Specific Gravity, Urine: 1.021 (ref 1.005–1.030)
pH: 7 (ref 5.0–8.0)

## 2020-10-22 LAB — COMPREHENSIVE METABOLIC PANEL
ALT: 43 U/L (ref 0–44)
AST: 37 U/L (ref 15–41)
Albumin: 4.1 g/dL (ref 3.5–5.0)
Alkaline Phosphatase: 51 U/L (ref 38–126)
Anion gap: 8 (ref 5–15)
BUN: 18 mg/dL (ref 6–20)
CO2: 23 mmol/L (ref 22–32)
Calcium: 9.1 mg/dL (ref 8.9–10.3)
Chloride: 106 mmol/L (ref 98–111)
Creatinine, Ser: 1.01 mg/dL (ref 0.61–1.24)
GFR, Estimated: >60 mL/min (ref >60)
Glucose, Bld: 147 mg/dL — ABNORMAL HIGH (ref 70–99)
Potassium: 3.9 mmol/L (ref 3.5–5.1)
Sodium: 137 mmol/L (ref 135–145)
Total Bilirubin: 1.1 mg/dL (ref 0.3–1.2)
Total Protein: 7.1 g/dL (ref 6.5–8.1)

## 2020-10-22 MED ORDER — IOHEXOL 300 MG/ML  SOLN
100.0000 mL | Freq: Once | INTRAMUSCULAR | Status: AC | PRN
  Administered 2020-10-22: 100 mL via INTRAVENOUS
  Filled 2020-10-22: qty 100

## 2020-10-22 MED ORDER — ONDANSETRON HCL 4 MG/2ML IJ SOLN
4.0000 mg | Freq: Once | INTRAMUSCULAR | Status: AC
  Administered 2020-10-22: 4 mg via INTRAVENOUS
  Filled 2020-10-22: qty 2

## 2020-10-22 MED ORDER — TRAMADOL HCL 50 MG PO TABS: 50.0000 mg | ORAL_TABLET | Freq: Four times a day (QID) | ORAL | 0 refills | Status: AC | PRN

## 2020-10-22 MED ORDER — BACLOFEN 10 MG PO TABS: 10.0000 mg | ORAL_TABLET | Freq: Three times a day (TID) | ORAL | 0 refills | Status: AC

## 2020-10-22 MED ORDER — MORPHINE SULFATE (PF) 4 MG/ML IV SOLN
4.0000 mg | Freq: Once | INTRAVENOUS | Status: AC
  Administered 2020-10-22: 4 mg via INTRAVENOUS
  Filled 2020-10-22: qty 1

## 2020-10-22 MED ORDER — MELOXICAM 15 MG PO TABS: 15.0000 mg | ORAL_TABLET | Freq: Every day | ORAL | 0 refills | Status: AC
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
## 2020-10-22 NOTE — Discharge Instructions (Signed)
Follow-up with orthopedics if you have continued neck pain in 1 week.  He may need physical therapy.  Use medications as prescribed.  Apply ice to all areas that hurt.  Return emergency department if worsening.
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
